# Patient Record
Sex: Female | Born: 1965 | Race: Black or African American | Hispanic: No | Marital: Single | Smoking: Never smoker
Health system: Southern US, Community
[De-identification: ages and names within clinical notes are randomized; demographics above are authoritative.]

## PROBLEM LIST (undated history)

## (undated) DIAGNOSIS — M542 Cervicalgia: Secondary | ICD-10-CM

---

## 2016-12-06 ENCOUNTER — Emergency Department (HOSPITAL_COMMUNITY)
Admission: EM | Admit: 2016-12-06 | Discharge: 2016-12-06 | Disposition: A | Discharge status: Home / Self Care | Payer: Non-veteran care | Attending: Emergency Medicine | Admitting: Emergency Medicine

## 2016-12-06 ENCOUNTER — Emergency Department (HOSPITAL_COMMUNITY): Payer: Non-veteran care

## 2016-12-06 ENCOUNTER — Encounter (HOSPITAL_COMMUNITY): Payer: Self-pay | Admitting: Emergency Medicine

## 2016-12-06 DIAGNOSIS — S4991XA Unspecified injury of right shoulder and upper arm, initial encounter: Secondary | ICD-10-CM | POA: Diagnosis not present

## 2016-12-06 DIAGNOSIS — Y999 Unspecified external cause status: Secondary | ICD-10-CM | POA: Insufficient documentation

## 2016-12-06 DIAGNOSIS — Y92410 Unspecified street and highway as the place of occurrence of the external cause: Secondary | ICD-10-CM | POA: Diagnosis not present

## 2016-12-06 DIAGNOSIS — S0081XA Abrasion of other part of head, initial encounter: Secondary | ICD-10-CM | POA: Diagnosis not present

## 2016-12-06 DIAGNOSIS — M25511 Pain in right shoulder: Secondary | ICD-10-CM

## 2016-12-06 DIAGNOSIS — S199XXA Unspecified injury of neck, initial encounter: Secondary | ICD-10-CM | POA: Diagnosis present

## 2016-12-06 DIAGNOSIS — Y939 Activity, unspecified: Secondary | ICD-10-CM | POA: Insufficient documentation

## 2016-12-06 DIAGNOSIS — S4992XA Unspecified injury of left shoulder and upper arm, initial encounter: Secondary | ICD-10-CM | POA: Insufficient documentation

## 2016-12-06 DIAGNOSIS — M62830 Muscle spasm of back: Secondary | ICD-10-CM | POA: Diagnosis not present

## 2016-12-06 DIAGNOSIS — M25512 Pain in left shoulder: Secondary | ICD-10-CM

## 2016-12-06 DIAGNOSIS — S161XXA Strain of muscle, fascia and tendon at neck level, initial encounter: Secondary | ICD-10-CM

## 2016-12-06 MED ORDER — DICLOFENAC SODIUM 50 MG PO TBEC: 50.0000 mg | DELAYED_RELEASE_TABLET | Freq: Two times a day (BID) | ORAL | 0 refills | Status: AC

## 2016-12-06 MED ORDER — BACITRACIN ZINC 500 UNIT/GM EX OINT: TOPICAL_OINTMENT | Freq: Once | CUTANEOUS | Status: DC

## 2016-12-06 MED ORDER — CYCLOBENZAPRINE HCL 10 MG PO TABS
10.0000 mg | ORAL_TABLET | Freq: Once | ORAL | Status: AC
  Administered 2016-12-06: 10 mg via ORAL
  Filled 2016-12-06: qty 1

## 2016-12-06 MED ORDER — IBUPROFEN 200 MG PO TABS
600.0000 mg | ORAL_TABLET | Freq: Once | ORAL | Status: AC
  Administered 2016-12-06: 02:00:00 600 mg via ORAL
  Filled 2016-12-06: qty 1

## 2016-12-06 MED ORDER — CYCLOBENZAPRINE HCL 10 MG PO TABS: 10.0000 mg | ORAL_TABLET | Freq: Two times a day (BID) | ORAL | 0 refills | Status: DC | PRN
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
## 2016-12-06 NOTE — ED Notes (Signed)
Pt undressed per provider's request
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
## 2016-12-06 NOTE — ED Notes (Signed)
Patient transported to CT 

## 2022-03-07 ENCOUNTER — Emergency Department (HOSPITAL_BASED_OUTPATIENT_CLINIC_OR_DEPARTMENT_OTHER): Payer: No Typology Code available for payment source

## 2022-03-07 ENCOUNTER — Emergency Department (HOSPITAL_BASED_OUTPATIENT_CLINIC_OR_DEPARTMENT_OTHER)
Admission: EM | Admit: 2022-03-07 | Discharge: 2022-03-07 | Disposition: A | Discharge status: Home / Self Care | Payer: No Typology Code available for payment source | Attending: Emergency Medicine | Admitting: Emergency Medicine

## 2022-03-07 ENCOUNTER — Encounter (HOSPITAL_BASED_OUTPATIENT_CLINIC_OR_DEPARTMENT_OTHER): Payer: Self-pay | Admitting: Emergency Medicine

## 2022-03-07 ENCOUNTER — Other Ambulatory Visit: Payer: Self-pay

## 2022-03-07 DIAGNOSIS — S161XXA Strain of muscle, fascia and tendon at neck level, initial encounter: Secondary | ICD-10-CM | POA: Insufficient documentation

## 2022-03-07 DIAGNOSIS — M545 Low back pain, unspecified: Secondary | ICD-10-CM | POA: Diagnosis not present

## 2022-03-07 DIAGNOSIS — S199XXA Unspecified injury of neck, initial encounter: Secondary | ICD-10-CM | POA: Diagnosis present

## 2022-03-07 DIAGNOSIS — R202 Paresthesia of skin: Secondary | ICD-10-CM | POA: Insufficient documentation

## 2022-03-07 DIAGNOSIS — Y92410 Unspecified street and highway as the place of occurrence of the external cause: Secondary | ICD-10-CM | POA: Diagnosis not present

## 2022-03-07 DIAGNOSIS — R0789 Other chest pain: Secondary | ICD-10-CM | POA: Diagnosis not present

## 2022-03-07 HISTORY — DX: Cervicalgia: M54.2

## 2022-03-07 MED ORDER — CYCLOBENZAPRINE HCL 10 MG PO TABS: 10.0000 mg | ORAL_TABLET | Freq: Two times a day (BID) | ORAL | 0 refills | Status: AC | PRN

## 2022-03-07 MED ORDER — IBUPROFEN 800 MG PO TABS
800.0000 mg | ORAL_TABLET | Freq: Once | ORAL | Status: AC
Start: 2022-03-07 — End: 2022-03-07
  Administered 2022-03-07: 800 mg via ORAL
  Filled 2022-03-07: qty 1

## 2022-03-07 MED ORDER — CYCLOBENZAPRINE HCL 5 MG PO TABS
5.0000 mg | ORAL_TABLET | Freq: Once | ORAL | Status: AC
  Administered 2022-03-07: 5 mg via ORAL
  Filled 2022-03-07: qty 1
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
## 2022-03-07 NOTE — ED Notes (Signed)
Soft Neck Collar placed on pt due to pt request and pain.
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
## 2023-04-13 IMAGING — CT CT CERVICAL SPINE W/O CM
3 of 4 series · 13 of 33 positions shown, 16 images · non-contrast
Comparison: None Available.

CLINICAL DATA: Trauma.



[Series 5: sagittal bone · sagittal · 0.31mm/px · 5 of 61 slices shown, 6 images]
[im 21/61  bone]
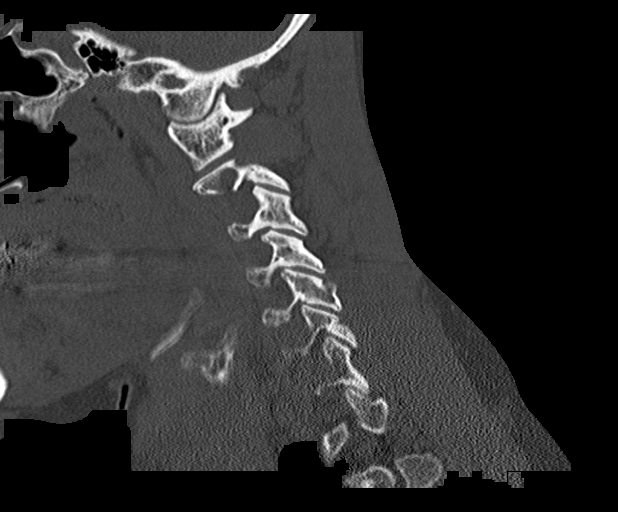
[im 26/61  bone]
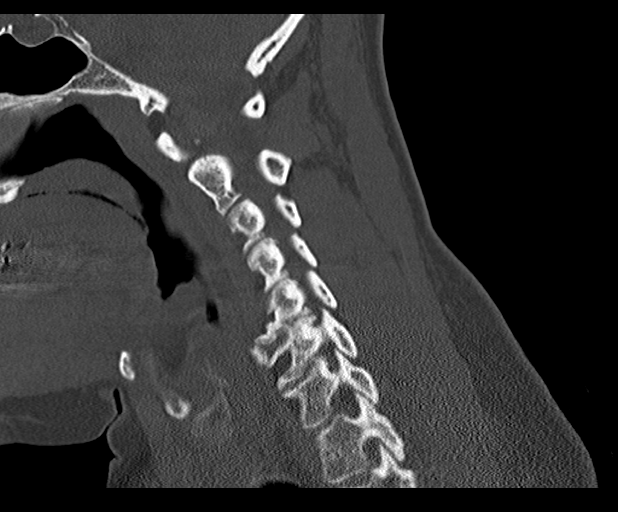
[im 31/61  soft-tissue]
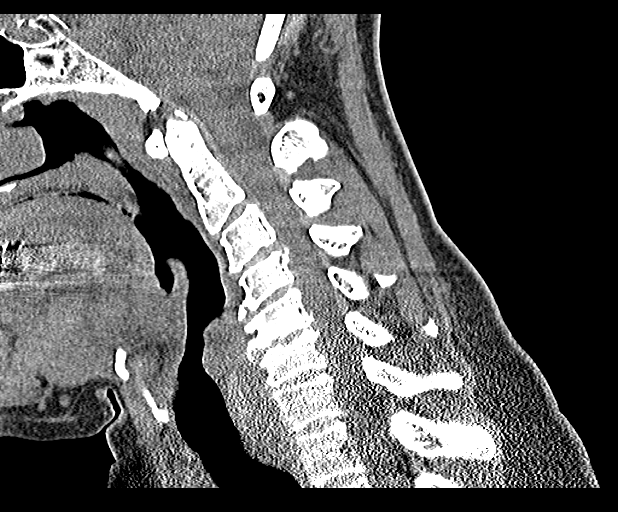
[im 31/61  bone]
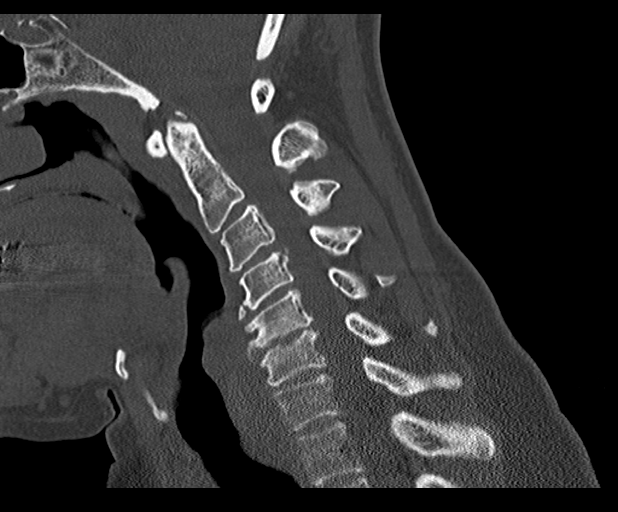
[im 36/61  bone]
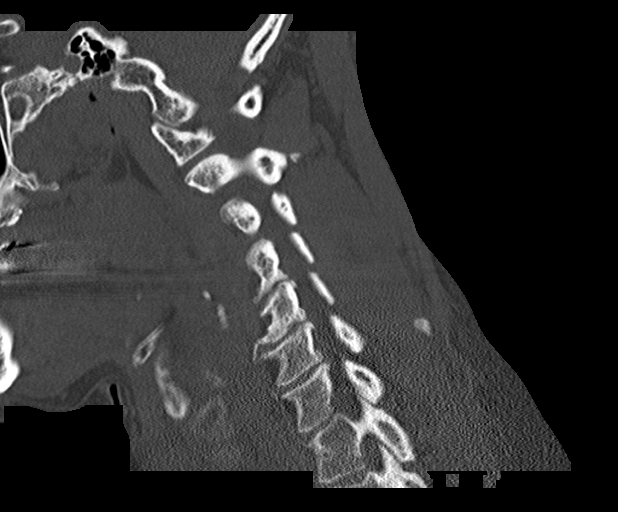
[im 41/61  bone]
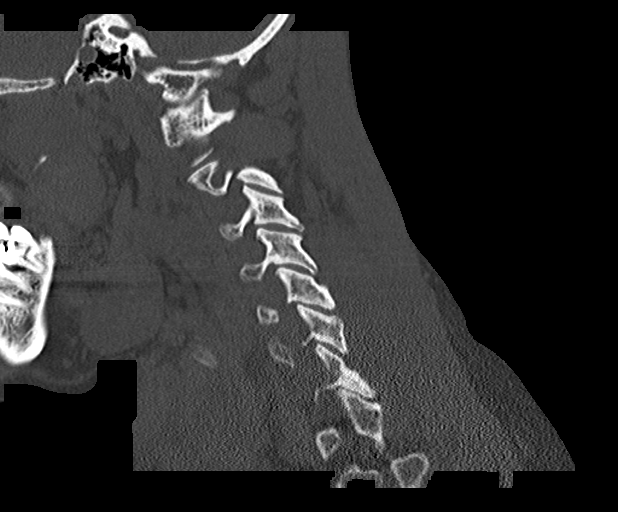

[Series 6: coronal bone · coronal · 0.23mm/px · 3 of 61 slices shown]
[im 13/61  bone]
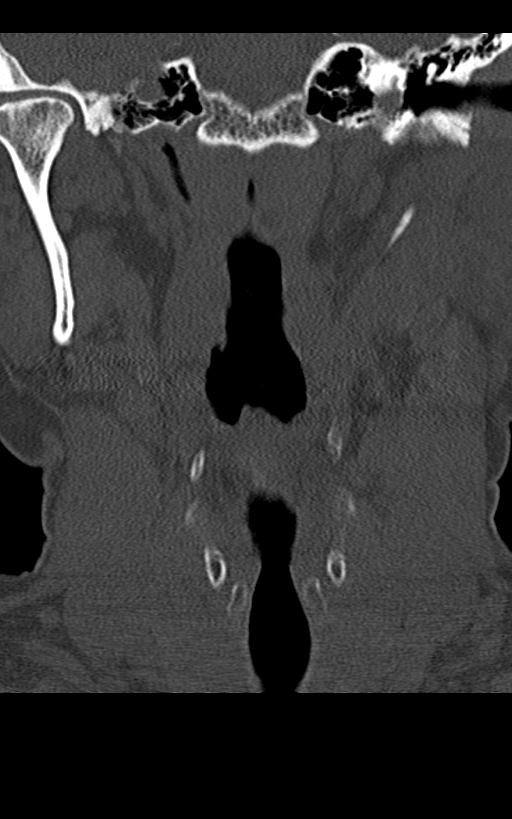
[im 25/61  bone]
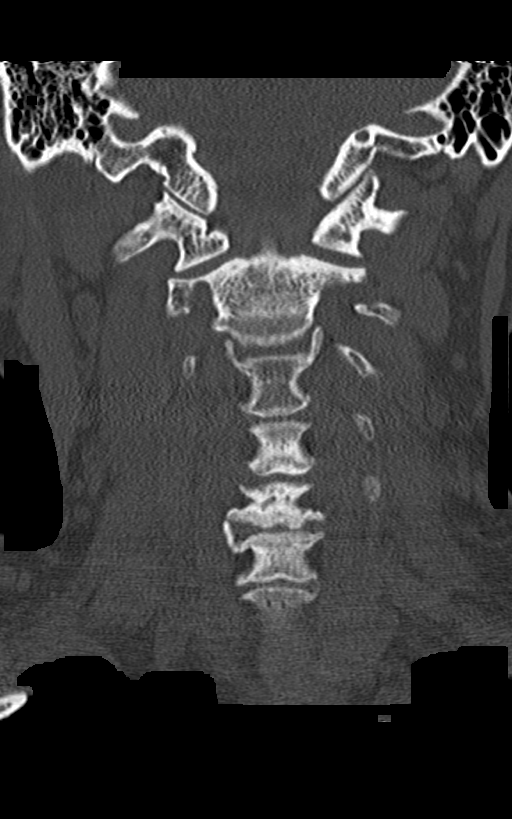
[im 37/61  bone]
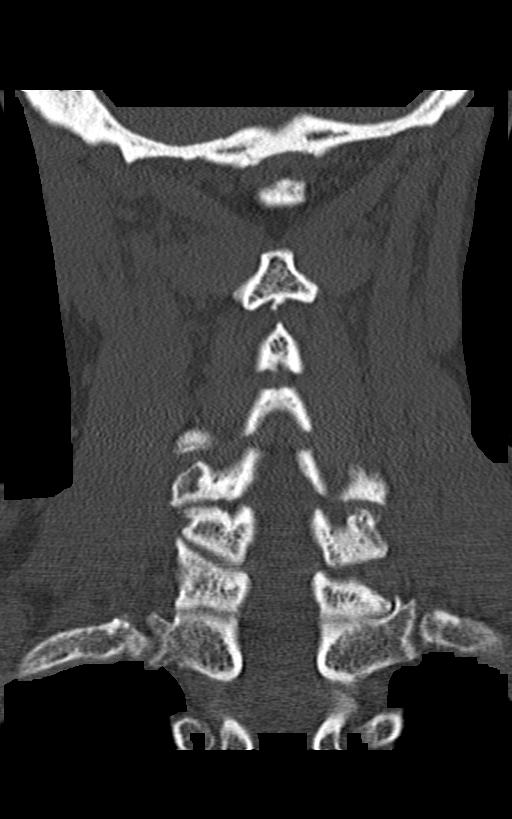

[Series 7: orthogonal bone · axial · 0.23mm/px · z∈[+993,+1090]mm · 5 of 79 slices shown, 7 images]
[im 14/79  soft-tissue]
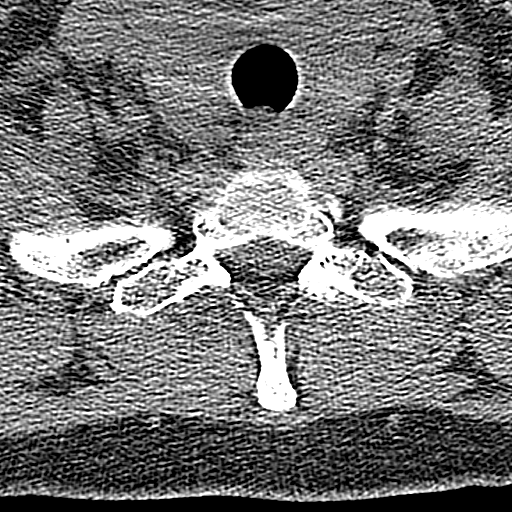
[im 14/79  bone]
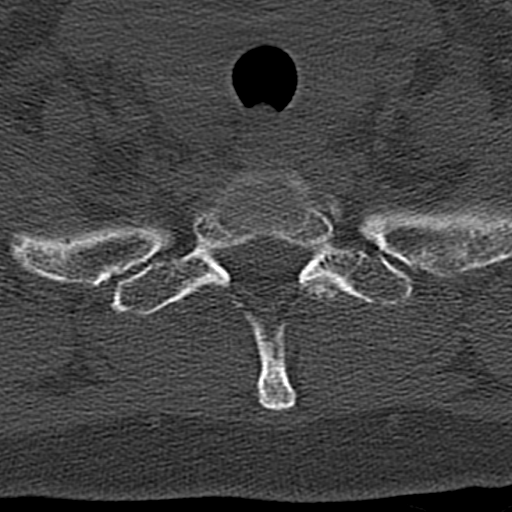
[im 27/79  bone]
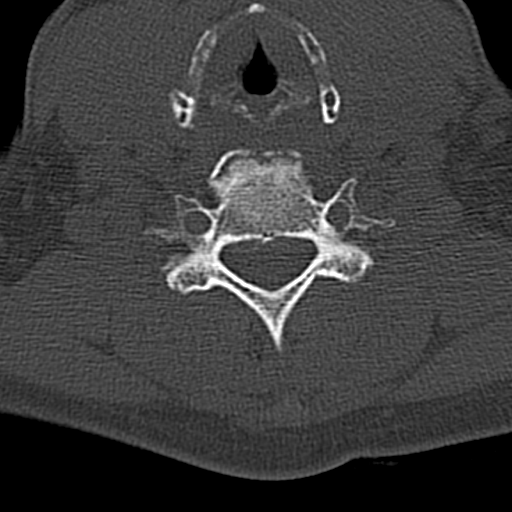
[im 40/79  bone]
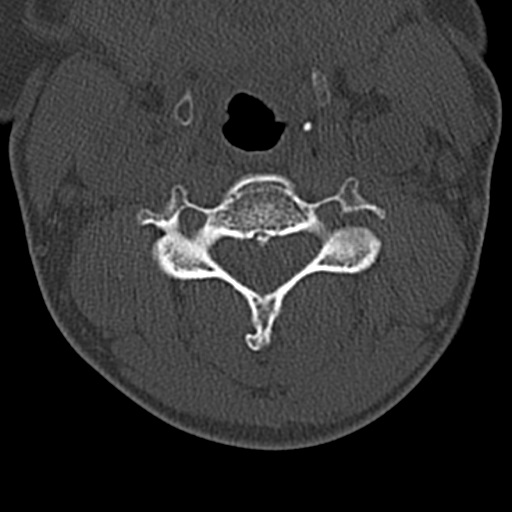
[im 53/79  bone]
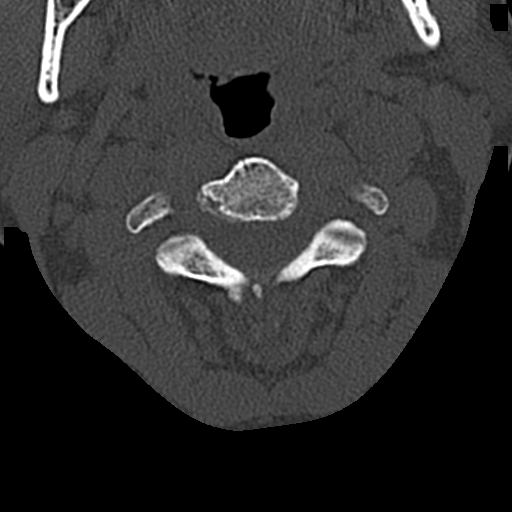
[im 66/79  soft-tissue]
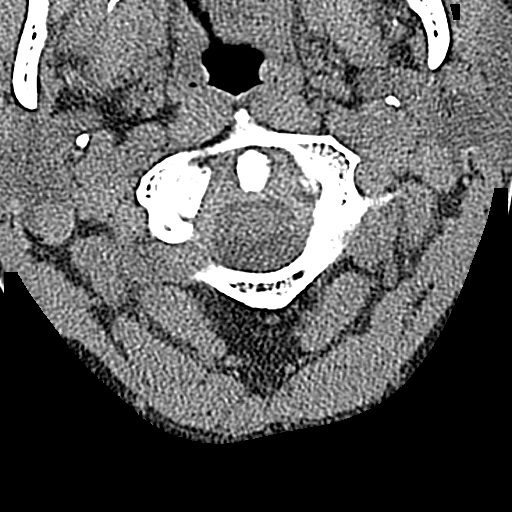
[im 66/79  bone]
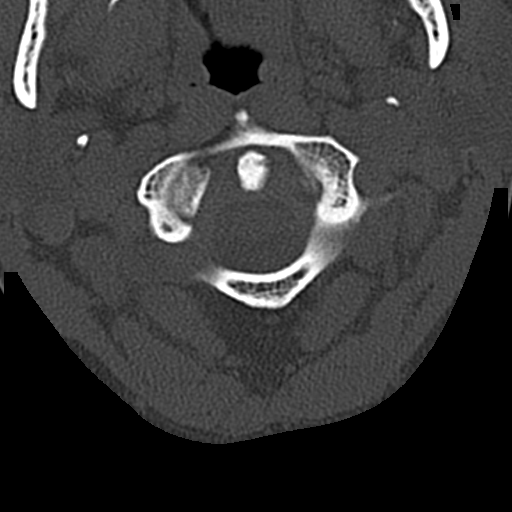

[13 of 33 positions shown; findings below may reference images not displayed]

FINDINGS: Alignment: Normal.

Skull base and vertebrae: No acute fracture. No primary bone lesion
or focal pathologic process.

Soft tissues and spinal canal: No prevertebral fluid or swelling. No
visible canal hematoma.

Disc levels: There is mild disc space narrowing and endplate
osteophyte formation throughout the cervical spine most significant
at C5-C6. Moderate right neural foraminal stenosis is seen at C5-C6
along with mild central canal stenosis secondary to disc osteophyte
complex and uncovertebral spurring.

Upper chest: Negative.

Other: None.
IMPRESSION: 1. No evidence for acute fracture or malalignment.
2. Degenerative changes as above.
